# Patient Record
Sex: Female | Born: 1965 | Race: White | Hispanic: Yes | Marital: Single | Smoking: Never smoker
Health system: Southern US, Community
[De-identification: ages and names within clinical notes are randomized; demographics above are authoritative.]

## PROBLEM LIST (undated history)

## (undated) DIAGNOSIS — E119 Type 2 diabetes mellitus without complications: Secondary | ICD-10-CM

## (undated) HISTORY — DX: Type 2 diabetes mellitus without complications: E11.9

---

## 2006-08-17 ENCOUNTER — Emergency Department: Payer: Self-pay | Admitting: Emergency Medicine

## 2007-12-24 ENCOUNTER — Ambulatory Visit: Payer: Self-pay

## 2009-10-26 ENCOUNTER — Emergency Department: Payer: Self-pay | Admitting: Internal Medicine

## 2014-06-26 ENCOUNTER — Other Ambulatory Visit: Payer: Self-pay | Admitting: Oncology

## 2014-06-26 DIAGNOSIS — Z139 Encounter for screening, unspecified: Secondary | ICD-10-CM

## 2014-07-31 ENCOUNTER — Other Ambulatory Visit: Payer: Self-pay | Admitting: Oncology

## 2014-07-31 DIAGNOSIS — Z139 Encounter for screening, unspecified: Secondary | ICD-10-CM

## 2014-08-05 ENCOUNTER — Ambulatory Visit: Payer: Self-pay

## 2014-09-09 ENCOUNTER — Ambulatory Visit
Admission: RE | Admit: 2014-09-09 | Discharge: 2014-09-09 | Disposition: A | Discharge status: Home / Self Care | Payer: Self-pay | Source: Ambulatory Visit | Attending: Oncology | Admitting: Oncology

## 2014-09-09 ENCOUNTER — Other Ambulatory Visit: Payer: Self-pay | Admitting: Oncology

## 2014-09-09 ENCOUNTER — Ambulatory Visit: Payer: Self-pay | Attending: Oncology

## 2014-09-09 VITALS — BP 147/91 | HR 69 | Temp 98.1°F | Resp 18 | Ht 62.6 in | Wt 171.6 lb

## 2014-09-09 DIAGNOSIS — Z0000 Encounter for general adult medical examination without abnormal findings: Secondary | ICD-10-CM

## 2014-09-09 DIAGNOSIS — Z139 Encounter for screening, unspecified: Secondary | ICD-10-CM

## 2014-09-09 NOTE — Progress Notes (Signed)
Subjective:     Patient ID: Kimberly Floyd, female   DOB: [birth year], 49 y.o.   MRN: [id number]  HPI   Review of Systems     Objective:   Physical Exam  Pulmonary/Chest: Right breast exhibits no inverted nipple, no mass, no nipple discharge, no skin change and no tenderness. Left breast exhibits no inverted nipple, no mass, no nipple discharge, no skin change and no tenderness. Breasts are symmetrical.       Assessment:      49 [facility]  Patient screened, and meets Kimberly Floyd eligibility.  Patient does not have insurance, Medicare or Medicaid.  Handout given on Affordable Care Act.  CBE unremarkable.  Instructed patient on breast self-exam using teach back method Plan:     Sent for bilateral screening mammogram.

## 2014-09-24 NOTE — Progress Notes (Signed)
Letter mailed from [facility] to notify of normal mammogram results.  Patient to return in one year for annual screening.  Copy to HSIS. 

## 2015-12-29 ENCOUNTER — Emergency Department
Admission: EM | Admit: 2015-12-29 | Discharge: 2015-12-29 | Disposition: A | Discharge status: Home / Self Care | Payer: Self-pay | Attending: Emergency Medicine | Admitting: Emergency Medicine

## 2015-12-29 ENCOUNTER — Encounter: Payer: Self-pay | Admitting: Medical Oncology

## 2015-12-29 DIAGNOSIS — Y9389 Activity, other specified: Secondary | ICD-10-CM | POA: Insufficient documentation

## 2015-12-29 DIAGNOSIS — T2210XA Burn of first degree of shoulder and upper limb, except wrist and hand, unspecified site, initial encounter: Secondary | ICD-10-CM

## 2015-12-29 DIAGNOSIS — T2220XA Burn of second degree of shoulder and upper limb, except wrist and hand, unspecified site, initial encounter: Secondary | ICD-10-CM

## 2015-12-29 DIAGNOSIS — T2020XA Burn of second degree of head, face, and neck, unspecified site, initial encounter: Secondary | ICD-10-CM

## 2015-12-29 DIAGNOSIS — T22232A Burn of second degree of left upper arm, initial encounter: Secondary | ICD-10-CM | POA: Insufficient documentation

## 2015-12-29 DIAGNOSIS — X12XXXA Contact with other hot fluids, initial encounter: Secondary | ICD-10-CM | POA: Insufficient documentation

## 2015-12-29 DIAGNOSIS — T2029XA Burn of second degree of multiple sites of head, face, and neck, initial encounter: Secondary | ICD-10-CM | POA: Insufficient documentation

## 2015-12-29 DIAGNOSIS — Y929 Unspecified place or not applicable: Secondary | ICD-10-CM | POA: Insufficient documentation

## 2015-12-29 DIAGNOSIS — Y999 Unspecified external cause status: Secondary | ICD-10-CM | POA: Insufficient documentation

## 2015-12-29 MED ORDER — HYDROCODONE-ACETAMINOPHEN 5-325 MG PO TABS: 1.0000 | ORAL_TABLET | ORAL | 0 refills | Status: AC | PRN

## 2015-12-29 MED ORDER — HYDROCODONE-ACETAMINOPHEN 5-325 MG PO TABS
ORAL_TABLET | ORAL | Status: AC
Start: 2015-12-29 — End: 2015-12-29
  Administered 2015-12-29: 1 via ORAL
  Filled 2015-12-29: qty 1

## 2015-12-29 MED ORDER — BACITRACIN ZINC 500 UNIT/GM EX OINT
TOPICAL_OINTMENT | Freq: Two times a day (BID) | CUTANEOUS | Status: DC
  Administered 2015-12-29: 1 via TOPICAL
  Filled 2015-12-29: qty 0.9

## 2015-12-29 MED ORDER — SILVER SULFADIAZINE 1 % EX CREA
TOPICAL_CREAM | CUTANEOUS | Status: AC
  Administered 2015-12-29: 1 via TOPICAL
  Filled 2015-12-29: qty 85

## 2015-12-29 MED ORDER — HYDROCODONE-ACETAMINOPHEN 5-325 MG PO TABS
1.0000 | ORAL_TABLET | Freq: Once | ORAL | Status: AC
  Administered 2015-12-29: 1 via ORAL

## 2015-12-29 MED ORDER — SILVER SULFADIAZINE 1 % EX CREA
TOPICAL_CREAM | Freq: Every day | CUTANEOUS | Status: DC
  Administered 2015-12-29: 1 via TOPICAL
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
## 2015-12-29 NOTE — ED Notes (Signed)
Pt alert and oriented X4, active, cooperative, pt in NAD. RR even and unlabored, color WNL.  Pt informed to return if any life threatening symptoms occur.   
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
## 2015-12-29 NOTE — ED Notes (Signed)
MD at bedside. 

## 2018-01-14 ENCOUNTER — Encounter (INDEPENDENT_AMBULATORY_CARE_PROVIDER_SITE_OTHER): Payer: Self-pay

## 2018-01-14 ENCOUNTER — Ambulatory Visit: Payer: Self-pay | Attending: Oncology | Admitting: *Deleted

## 2018-01-14 ENCOUNTER — Encounter: Payer: Self-pay | Admitting: *Deleted

## 2018-01-14 VITALS — BP 151/89 | HR 77 | Temp 98.8°F | Ht 60.0 in | Wt 157.0 lb

## 2018-01-14 DIAGNOSIS — N63 Unspecified lump in unspecified breast: Secondary | ICD-10-CM

## 2018-01-14 NOTE — Patient Instructions (Signed)
Patient has been screened for eligibility.  She does not have any insurance, Medicare or Medicaid.  She also meets financial eligibility.  Hand-out given on the Affordable Care Act. 

## 2018-01-14 NOTE — Progress Notes (Signed)
  Subjective:     Patient ID: Kimberly Floyd, female   DOB: [birth year], 52 y.o.   MRN: [id number]  HPI   Review of Systems     Objective:   Physical Exam  Pulmonary/Chest: Right breast exhibits no inverted nipple, no mass, no nipple discharge, no skin change and no tenderness. Left breast exhibits mass. Left breast exhibits no inverted nipple, no nipple discharge, no skin change and no tenderness.         Assessment:     52 year old Hispanic female returns to [facility] for annual screening.  Kimberly Floyd, the interpreter present during the interview and exam.  The patient states she has a lump in the left axilla.  I can palpate an approximate 1 cm mobile, firm nodule in the left axilla in the semi fowlers position only.  Patient has a family history of ovarian cancer in her paternal grandmother.  Patient has been screened for eligibility.  She does not have any insurance, Medicare or Medicaid.  She also meets financial eligibility.  Hand-out given on the Affordable Care Act.  Risk Assessment    Risk Scores      01/14/2018   Last edited by: Jim Like, RN   5-year risk: 0.8 %   Lifetime risk: 6.8 %            Plan:     Will order bilateral diagnostic mammogram and ultrasound.  If no findings on imaging will refer for surgical consult for further evaluation.  Will follow-up per [facility] protocol.

## 2018-01-28 ENCOUNTER — Ambulatory Visit
Admission: RE | Admit: 2018-01-28 | Discharge: 2018-01-28 | Disposition: A | Discharge status: Home / Self Care | Payer: Self-pay | Source: Ambulatory Visit | Attending: Oncology | Admitting: Oncology

## 2018-01-28 DIAGNOSIS — N63 Unspecified lump in unspecified breast: Secondary | ICD-10-CM

## 2018-02-21 ENCOUNTER — Encounter: Payer: Self-pay | Admitting: Family Medicine

## 2018-02-26 ENCOUNTER — Encounter: Payer: Self-pay | Admitting: *Deleted

## 2018-02-26 NOTE — Progress Notes (Signed)
Left message for patient to return my call via the Language Line interpreter [place] [phone].  Benign axillary lymph node noted at the site of palpable findings.  I would like to have the patient return for a repeat clinical breast exam.

## 2018-04-29 ENCOUNTER — Encounter: Payer: Self-pay | Admitting: *Deleted

## 2018-04-29 NOTE — Progress Notes (Signed)
Patient has not responded to the message I left her in December.  Since the ultrasound shows an axillary superficial lymph node at the site of palpable concern, we can have the patient return in one year for screening mammogram as recommended by the radiologist.  Patient was notified of the results by the radiologist and by letter from the [facility].  [facility]IS to [place].

## 2019-10-14 ENCOUNTER — Other Ambulatory Visit: Payer: Self-pay

## 2019-10-14 ENCOUNTER — Encounter: Payer: Self-pay | Admitting: *Deleted

## 2019-10-14 ENCOUNTER — Encounter (INDEPENDENT_AMBULATORY_CARE_PROVIDER_SITE_OTHER): Payer: Self-pay

## 2019-10-14 ENCOUNTER — Ambulatory Visit: Payer: Self-pay | Attending: Oncology | Admitting: *Deleted

## 2019-10-14 ENCOUNTER — Ambulatory Visit
Admission: RE | Admit: 2019-10-14 | Discharge: 2019-10-14 | Disposition: A | Discharge status: Home / Self Care | Payer: Self-pay | Source: Ambulatory Visit | Attending: Oncology | Admitting: Oncology

## 2019-10-14 VITALS — BP 149/83 | HR 69 | Temp 96.8°F | Resp 20 | Ht 60.0 in | Wt 160.9 lb

## 2019-10-14 DIAGNOSIS — Z0000 Encounter for general adult medical examination without abnormal findings: Secondary | ICD-10-CM

## 2019-10-14 NOTE — Progress Notes (Signed)
  Subjective:     Patient ID: Kimberly Floyd, female   DOB: 1965-11-05, 54 y.o.   MRN: [id number]  HPI   [facility] Medical History Record - 10/14/19 7829      Breast History   Screening cycle Rescreen    CBE Date 10/14/19    Provider (CBE) [facility]    Last Mammogram Annual    Last Mammogram Date 03/30/17    Recent Breast Symptoms None      Breast Cancer History   Breast Cancer History No personal or family history      Previous History of Breast Problems   Breast Surgery or Biopsy None    Breast Implants N/A    BSE Done Monthly      Gynecological/Obstetrical History   LMP 02/24/19    Is there any chance that the client could be pregnant?  No    Age at menopause 28    PAP smear history Annually    Date of last PAP  08/05/19    Breast fed children No    DES Exposure No    Cervical, Uterine or Ovarian cancer No    Family history of Cervial, Uterine or Ovarian cancer Yes   father side grandmother   Hysterectomy No    Cervix removed No    Ovaries removed No    Laser/Cryosurgery No    Current method of birth control None    Current method of Estrogen/Hormone replacement None    Smoking history None            Review of Systems     Objective:   Physical Exam Chest:     Breasts:        Right: No swelling, bleeding, inverted nipple, mass, nipple discharge, skin change or tenderness.        Left: Mass present. No swelling, bleeding, inverted nipple, nipple discharge, skin change or tenderness.    Lymphadenopathy:     Upper Body:     Right upper body: No supraclavicular or axillary adenopathy.     Left upper body: No supraclavicular or axillary adenopathy.        Assessment:     54 year old Hispanic female returns to [facility] for annual screening.  Kimberly Floyd, the interpreter present during the interview and exam.  Patient with excess axillary breast tissue.  States she still has the left axillary nodule.  There is an approximate 1 cm mass in the left axilla that is  unchanged since last year.  Normal mammogram and ultrasound last year.  Patient states it's been present for approximately 5 years.  Taught self breast awareness.  Discussed importance of annual mammograms.  Patient with family history of ovarian cancer in her PGM.  Patient states she was in her 54's and did not have any genetic testing.  Last pap smear was on 08/05/19 and was negative / negative.  Next pap in 5 years.  Patient has been screened for eligibility.  She does not have any insurance, Medicare or Medicaid.  She also meets financial eligibility.   Risk Assessment    Risk Scores      10/14/2019 01/14/2018   Last edited by: Neita Garnet, CMA Jim Like, RN   5-year risk: 0.9 % 0.8 %   Lifetime risk: 6.5 % 6.8 %            Plan:     Screening mammogram ordered.  Will follow up per [facility] protocol.

## 2019-10-14 NOTE — Patient Instructions (Signed)
Gave patient hand-out, Women Staying Healthy, Active and Well from [facility], with education on breast health, pap smears, heart and colon health. 

## 2019-10-15 IMAGING — MG DIGITAL DIAGNOSTIC BILATERAL MAMMOGRAM WITH TOMO AND CAD
8 of 12 series · 8 of 32 positions shown · non-contrast
Comparison: Previous exam(s).

CLINICAL DATA: Left palpable axillary mass, felt by the patient for
approximately 1 year.

EXAM:
DIGITAL DIAGNOSTIC BILATERAL MAMMOGRAM WITH CAD AND TOMO
ULTRASOUND LEFT BREAST

[R ML]
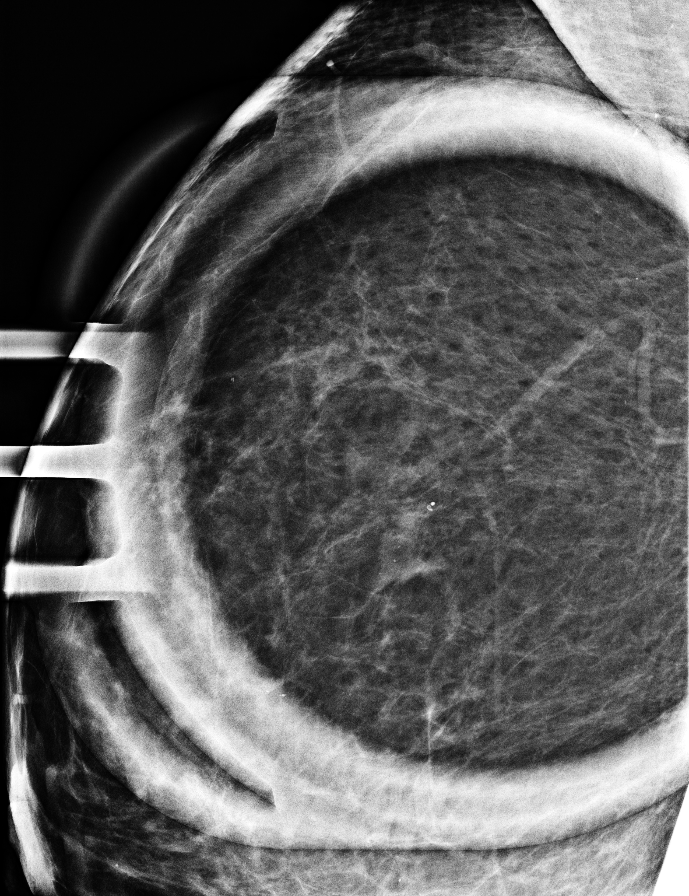

[R CC]
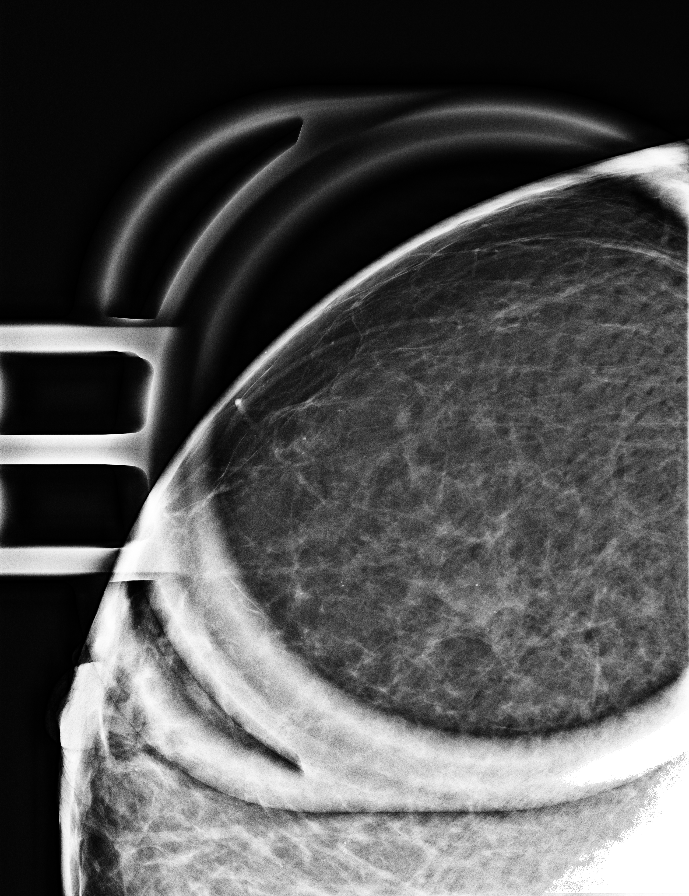

[L CC synth-2D]
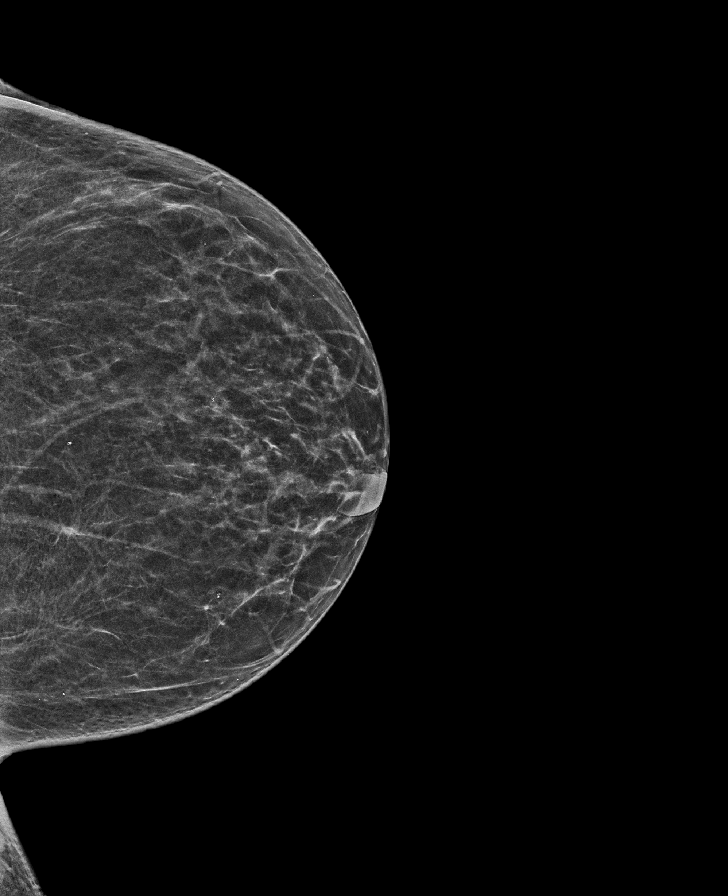

[R CC synth-2D]
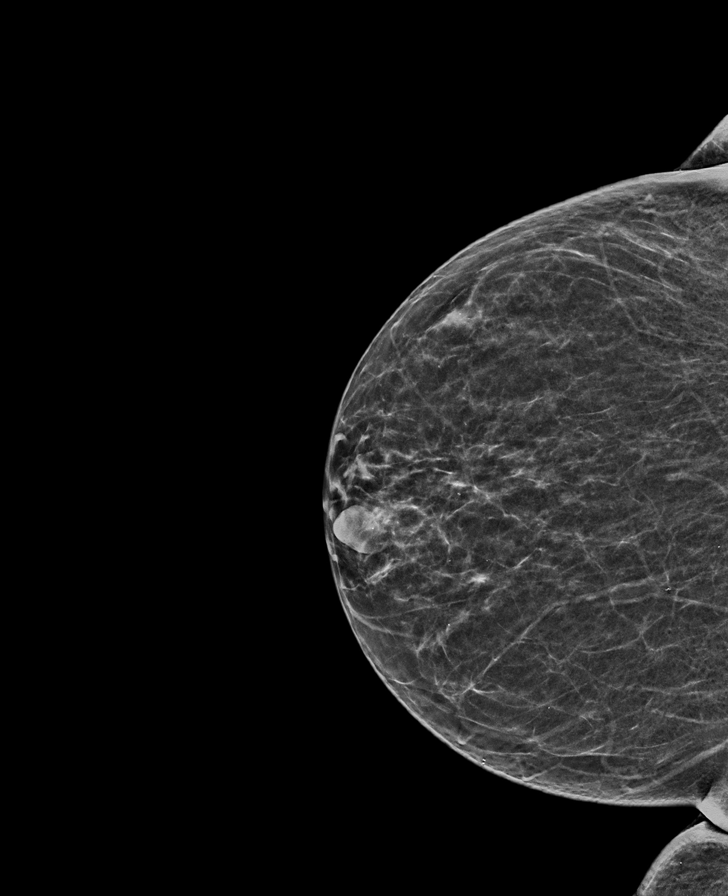

[R MLO synth-2D]
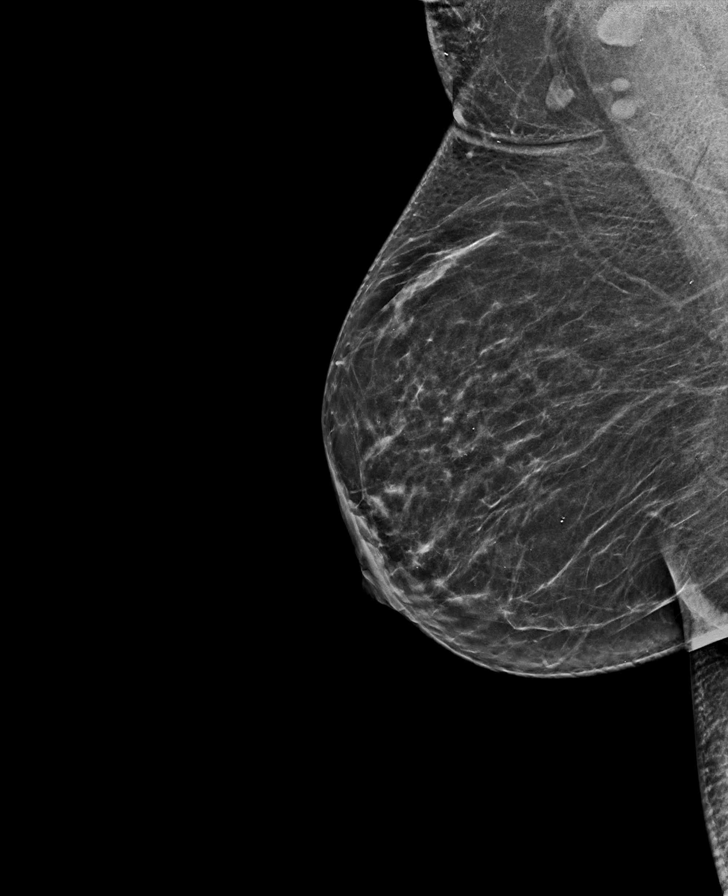

[L MLO synth-2D]
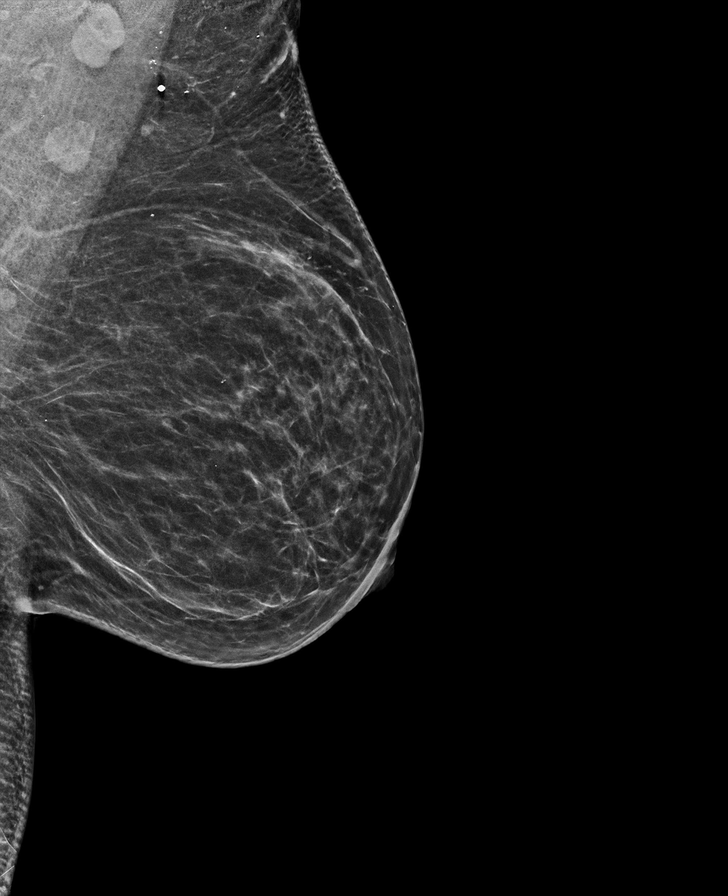

[L TAN synth-2D]
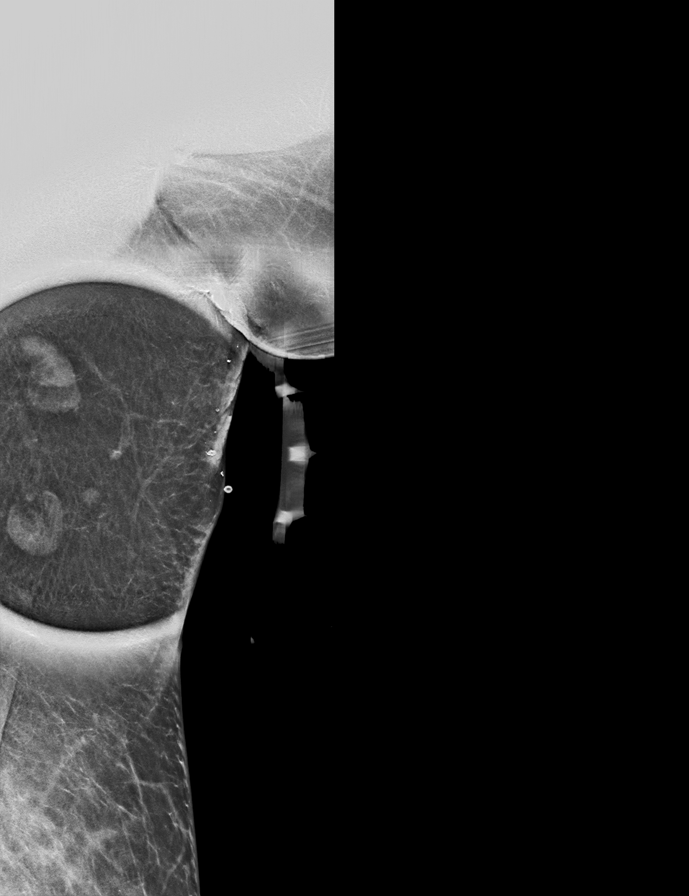

[L CC tomo · tomo slice 27/53.0]
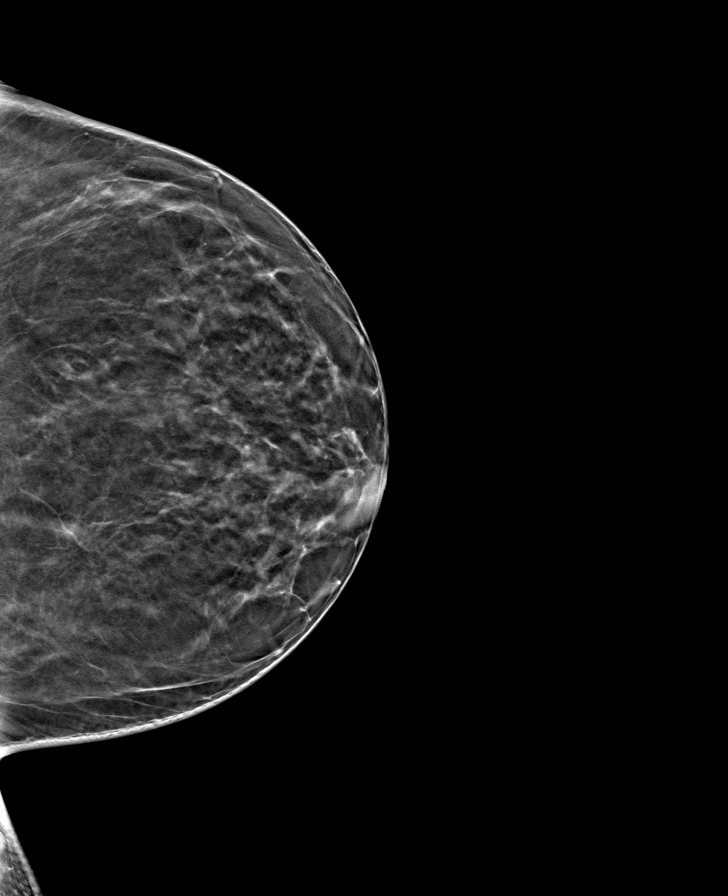

[8 of 32 positions shown; findings below may reference images not displayed]

ACR Breast Density Category b: There are scattered areas of
fibroglandular density.
FINDINGS: Mammographically, there are no suspicious masses, areas of
architectural distortion or clustered microcalcifications in either
breast. A non mass appearing focal asymmetry is seen in the right
breast upper outer quadrant, anterior depth, with a few overlying
non clustered calcifications. Compression magnification views of the
right breast upper outer quadrant demonstrate effacement of the
asymmetry seen on the on the conventional views. The overlying
calcifications have benign appearance. No suspicious masses are seen
at the area of palpable concern in the left axilla.

Mammographic images were processed with CAD.

On physical exam, no suspicious masses are palpated.

Targeted right breast ultrasound is performed, showing no suspicious
masses or shadowing lesions in the right breast upper outer
quadrant, confirming benign nature of the question focal asymmetry.

Targeted left axillary ultrasound is performed. No suspicious masses
are seen at the site of palpable concern. There is a superficial
benign-appearing left axillary lymph node which may correspond to
the area of palpable concern described by the patient.
IMPRESSION: No mammographic or sonographic evidence of malignancy in either
breast.

RECOMMENDATION:
Screening mammogram in one year.(Code:UE-3-164)

I have discussed the findings and recommendations with the patient.
Results were also provided in writing at the conclusion of the
visit. If applicable, a reminder letter will be sent to the patient
regarding the next appointment.

BI-RADS CATEGORY  2: Benign.

## 2019-10-16 ENCOUNTER — Encounter: Payer: Self-pay | Admitting: *Deleted

## 2019-10-16 NOTE — Progress Notes (Signed)
Letter mailed from the [facility] to inform patient of her normal mammogram results.  Patient is to follow-up with annual screening in one year. 

## 2022-04-19 ENCOUNTER — Other Ambulatory Visit: Payer: Self-pay

## 2022-04-19 DIAGNOSIS — Z1231 Encounter for screening mammogram for malignant neoplasm of breast: Secondary | ICD-10-CM

## 2022-05-22 ENCOUNTER — Ambulatory Visit: Payer: Self-pay | Attending: Hematology and Oncology | Admitting: Hematology and Oncology

## 2022-05-22 ENCOUNTER — Ambulatory Visit
Admission: RE | Admit: 2022-05-22 | Discharge: 2022-05-22 | Disposition: A | Discharge status: Home / Self Care | Payer: Self-pay | Source: Ambulatory Visit | Attending: Obstetrics and Gynecology | Admitting: Obstetrics and Gynecology

## 2022-05-22 VITALS — BP 160/81 | Wt 154.7 lb

## 2022-05-22 DIAGNOSIS — Z1231 Encounter for screening mammogram for malignant neoplasm of breast: Secondary | ICD-10-CM

## 2022-05-22 NOTE — Progress Notes (Signed)
[name] is a 57 y.o. female who presents to [facility] clinic today with no complaints.    Pap Smear: Pap not smear completed today. Last Pap smear was 08/05/2019 at [facility] clinic and was normal. Per patient has no history of an abnormal Pap smear. Last Pap smear result is available in Epic. She states she will get her Pap smear this year at [facility] with her regularly scheduled appointment.    Physical exam: Breasts Breasts symmetrical. No skin abnormalities bilateral breasts. No nipple retraction bilateral breasts. No nipple discharge bilateral breasts. No lymphadenopathy. No lumps palpated bilateral breasts.     MS DIGITAL SCREENING TOMO BILATERAL  Result Date: 10/15/2019 CLINICAL DATA:  Screening. EXAM: DIGITAL SCREENING BILATERAL MAMMOGRAM WITH TOMO AND CAD COMPARISON:  Previous exam(s). ACR Breast Density Category b: There are scattered areas of fibroglandular density. FINDINGS: There are no findings suspicious for malignancy. Images were processed with CAD. IMPRESSION: No mammographic evidence of malignancy. A result letter of this screening mammogram will be mailed directly to the patient. RECOMMENDATION: Screening mammogram in one year. (Code:SM-B-01Y) BI-RADS CATEGORY  1: Negative. Electronically Signed   By: Kimberly Floyd M.D.   On: 10/15/2019 14:08   MS DIGITAL DIAG TOMO BILAT  Result Date: 01/28/2018 CLINICAL DATA:  Left palpable axillary mass, felt by the patient for approximately 1 year. EXAM: DIGITAL DIAGNOSTIC BILATERAL MAMMOGRAM WITH CAD AND TOMO ULTRASOUND LEFT BREAST COMPARISON:  Previous exam(s). ACR Breast Density Category b: There are scattered areas of fibroglandular density. FINDINGS: Mammographically, there are no suspicious masses, areas of architectural distortion or clustered microcalcifications in either breast. A non mass appearing focal asymmetry is seen in the right breast upper outer quadrant, anterior depth, with a few overlying non  clustered calcifications. Compression magnification views of the right breast upper outer quadrant demonstrate effacement of the asymmetry seen on the on the conventional views. The overlying calcifications have benign appearance. No suspicious masses are seen at the area of palpable concern in the left axilla. Mammographic images were processed with CAD. On physical exam, no suspicious masses are palpated. Targeted right breast ultrasound is performed, showing no suspicious masses or shadowing lesions in the right breast upper outer quadrant, confirming benign nature of the question focal asymmetry. Targeted left axillary ultrasound is performed. No suspicious masses are seen at the site of palpable concern. There is a superficial benign-appearing left axillary lymph node which may correspond to the area of palpable concern described by the patient. IMPRESSION: No mammographic or sonographic evidence of malignancy in either breast. RECOMMENDATION: Screening mammogram in one year.(Code:SM-B-01Y) I have discussed the findings and recommendations with the patient. Results were also provided in writing at the conclusion of the visit. If applicable, a reminder letter will be sent to the patient regarding the next appointment. BI-RADS CATEGORY  2: Benign. Electronically Signed   By: Kimberly Floyd M.D.   On: 01/28/2018 12:04      Pelvic/Bimanual Pap is not indicated today    Smoking History: Patient has never smoked and was not referred to quit line.    Patient Navigation: Patient education provided. Access to services provided for patient through [place] interpreter provided. No transportation provided   Colorectal Cancer Screening: Per patient has never had colonoscopy completed No complaints today. FIT test completed 05/10/22 with [facility].   Breast and Cervical Cancer Risk Assessment: Patient does not have family history of breast cancer, known genetic mutations,  or radiation treatment to the chest  before age 90. Patient does not have history of cervical dysplasia, immunocompromised, or DES exposure in-utero.  Risk Scores as of 05/22/2022     Kimberly Floyd           5-year 1.37 %   Lifetime 8.9 %   This patient is Hisp[place]/Latina but has no documented birth country, so the [place] used data from [place] patients to calculate their risk score. Document a birth country in the Demographics activity for a more accurate score.         Last calculated by Kimberly Revel, LPN on [id number] at 075-GRM AM        A: [facility] exam without pap smear No complaints with benign exam.   P: Referred [facility] to the [facility] for a screening mammogram. Appointment scheduled 05/22/22.  Kimberly Floyd A, NP 05/22/2022 10:13 AM

## 2022-05-22 NOTE — Patient Instructions (Addendum)
[place] about self breast awareness and gave educational materials to take home. Patient did not need a Pap smear today due to last Pap smear was in 2021 per patient. Let her know [facility] will cover Pap smears every 5 years unless has a history of abnormal Pap smears. She prefers to have her Pap smear at her regular office visit in [facility]. Referred patient to the [facility] for screening mammogram. Appointment scheduled for 05/22/22. Patient aware of appointment and will be there. Let patient know will follow up with her within the next couple weeks with results. [place] verbalized understanding.  Melodye Ped, NP 9:57 AM

## 2022-05-23 ENCOUNTER — Other Ambulatory Visit: Payer: Self-pay | Admitting: *Deleted

## 2022-05-23 ENCOUNTER — Inpatient Hospital Stay
Admission: RE | Admit: 2022-05-23 | Discharge: 2022-05-23 | Disposition: A | Discharge status: Home / Self Care | Payer: Self-pay | Source: Ambulatory Visit | Attending: *Deleted | Admitting: *Deleted

## 2022-05-23 DIAGNOSIS — Z1231 Encounter for screening mammogram for malignant neoplasm of breast: Secondary | ICD-10-CM

## 2022-07-25 ENCOUNTER — Telehealth: Payer: Self-pay | Admitting: Hematology and Oncology

## 2023-09-12 ENCOUNTER — Encounter: Payer: Self-pay | Admitting: Nurse Practitioner

## 2023-09-13 ENCOUNTER — Telehealth: Payer: Self-pay | Admitting: *Deleted

## 2023-09-17 ENCOUNTER — Inpatient Hospital Stay
Admission: RE | Admit: 2023-09-17 | Discharge: 2023-09-17 | Disposition: A | Discharge status: Home / Self Care | Payer: Self-pay | Source: Ambulatory Visit | Attending: Nurse Practitioner | Admitting: Nurse Practitioner

## 2023-09-17 ENCOUNTER — Other Ambulatory Visit: Payer: Self-pay | Admitting: *Deleted

## 2023-09-17 DIAGNOSIS — Z1231 Encounter for screening mammogram for malignant neoplasm of breast: Secondary | ICD-10-CM

## 2024-03-17 ENCOUNTER — Other Ambulatory Visit: Payer: Self-pay | Admitting: Family Medicine

## 2024-03-17 DIAGNOSIS — Z1231 Encounter for screening mammogram for malignant neoplasm of breast: Secondary | ICD-10-CM

## 2024-04-09 ENCOUNTER — Ambulatory Visit
Admission: RE | Admit: 2024-04-09 | Discharge: 2024-04-09 | Disposition: A | Discharge status: Home / Self Care | Payer: Self-pay | Source: Ambulatory Visit | Attending: Family Medicine | Admitting: Family Medicine

## 2024-04-09 DIAGNOSIS — Z1231 Encounter for screening mammogram for malignant neoplasm of breast: Secondary | ICD-10-CM | POA: Insufficient documentation
